# Patient Record
Sex: Female | Born: 1985 | Race: White | Hispanic: No | Marital: Married | Smoking: Former smoker
Health system: Southern US, Community
[De-identification: ages and names within clinical notes are randomized; demographics above are authoritative.]

## PROBLEM LIST (undated history)

## (undated) DIAGNOSIS — J45909 Unspecified asthma, uncomplicated: Secondary | ICD-10-CM

## (undated) HISTORY — PX: CHOLECYSTECTOMY: SHX55

## (undated) HISTORY — PX: TONSILECTOMY, ADENOIDECTOMY, BILATERAL MYRINGOTOMY AND TUBES: SHX2538

## (undated) HISTORY — PX: WISDOM TOOTH EXTRACTION: SHX21

---

## 2017-01-15 NOTE — L&D Delivery Note (Addendum)
Delivery Note At 4:37 AM a viable female was delivered via Vaginal, Spontaneous (Presentation: LOA).  APGAR: 9, 9; weight  pending.   Placenta status: intact w/ 3vc:  with the following complications: none .  Cord pH: pending.  Placenta to path  Delivery performed with Aundria Rudogers, CNM present.  FOB cut cord at ~1.765min post delivery.  Placenta delievered without incident.  Anesthesia:  epidural Episiotomy:  N./A Lacerations:  1st degree perineal, hemostatic not requiring sutures Suture Repair: n/a Est. Blood Loss (mL):  300  Mom to postpartum, 3rd floor for 24hrs mag.  Baby to Couplet care / Skin to Skin.  Marthenia RollingScott Bland, DO 09/14/2017, 5:03 AM   I was gloved and present at this delivery in its entirety   Sharyon CableVeronica C Raymona Boss, CNM 09/14/17, 8:37 AM

## 2017-09-13 ENCOUNTER — Inpatient Hospital Stay (HOSPITAL_BASED_OUTPATIENT_CLINIC_OR_DEPARTMENT_OTHER): Payer: Self-pay

## 2017-09-13 ENCOUNTER — Inpatient Hospital Stay (HOSPITAL_COMMUNITY)
Admission: AD | Admit: 2017-09-13 | Discharge: 2017-09-16 | DRG: 807 | Disposition: A | Discharge status: Home / Self Care | Payer: Self-pay | Source: Ambulatory Visit | Attending: Obstetrics & Gynecology | Admitting: Obstetrics & Gynecology

## 2017-09-13 ENCOUNTER — Other Ambulatory Visit: Payer: Self-pay

## 2017-09-13 ENCOUNTER — Encounter (HOSPITAL_COMMUNITY): Payer: Self-pay | Admitting: *Deleted

## 2017-09-13 DIAGNOSIS — Z87891 Personal history of nicotine dependence: Secondary | ICD-10-CM

## 2017-09-13 DIAGNOSIS — O09293 Supervision of pregnancy with other poor reproductive or obstetric history, third trimester: Secondary | ICD-10-CM

## 2017-09-13 DIAGNOSIS — Z789 Other specified health status: Secondary | ICD-10-CM | POA: Diagnosis present

## 2017-09-13 DIAGNOSIS — O1413 Severe pre-eclampsia, third trimester: Secondary | ICD-10-CM

## 2017-09-13 DIAGNOSIS — O1414 Severe pre-eclampsia complicating childbirth: Principal | ICD-10-CM | POA: Diagnosis present

## 2017-09-13 DIAGNOSIS — O0933 Supervision of pregnancy with insufficient antenatal care, third trimester: Secondary | ICD-10-CM

## 2017-09-13 DIAGNOSIS — Z3A36 36 weeks gestation of pregnancy: Secondary | ICD-10-CM

## 2017-09-13 DIAGNOSIS — Z880 Allergy status to penicillin: Secondary | ICD-10-CM

## 2017-09-13 DIAGNOSIS — Z3A39 39 weeks gestation of pregnancy: Secondary | ICD-10-CM

## 2017-09-13 DIAGNOSIS — O133 Gestational [pregnancy-induced] hypertension without significant proteinuria, third trimester: Secondary | ICD-10-CM

## 2017-09-13 DIAGNOSIS — O1493 Unspecified pre-eclampsia, third trimester: Secondary | ICD-10-CM

## 2017-09-13 HISTORY — DX: Unspecified asthma, uncomplicated: J45.909

## 2017-09-13 LAB — URINALYSIS, ROUTINE W REFLEX MICROSCOPIC
Bilirubin Urine: NEGATIVE
Glucose, UA: NEGATIVE mg/dL
Ketones, ur: NEGATIVE mg/dL
Nitrite: NEGATIVE
Protein, ur: 30 mg/dL — AB
SPECIFIC GRAVITY, URINE: 1.011 (ref 1.005–1.030)
pH: 6 (ref 5.0–8.0)

## 2017-09-13 LAB — PROTEIN / CREATININE RATIO, URINE
CREATININE, URINE: 72 mg/dL
Protein Creatinine Ratio: 0.93 mg/mg{Cre} — ABNORMAL HIGH (ref 0.00–0.15)
TOTAL PROTEIN, URINE: 67 mg/dL

## 2017-09-13 LAB — RAPID HIV SCREEN (HIV 1/2 AB+AG)
HIV 1/2 ANTIBODIES: NONREACTIVE
HIV-1 P24 Antigen - HIV24: NONREACTIVE

## 2017-09-13 LAB — COMPREHENSIVE METABOLIC PANEL
ALBUMIN: 2.7 g/dL — AB (ref 3.5–5.0)
ALK PHOS: 175 U/L — AB (ref 38–126)
ALT: 16 U/L (ref 0–44)
ANION GAP: 12 (ref 5–15)
AST: 25 U/L (ref 15–41)
BUN: 10 mg/dL (ref 6–20)
CALCIUM: 8.5 mg/dL — AB (ref 8.9–10.3)
CO2: 17 mmol/L — AB (ref 22–32)
Chloride: 105 mmol/L (ref 98–111)
Creatinine, Ser: 0.6 mg/dL (ref 0.44–1.00)
GFR calc Af Amer: >60 mL/min (ref >60)
GFR calc non Af Amer: >60 mL/min (ref >60)
Glucose, Bld: 71 mg/dL (ref 70–99)
Potassium: 3.8 mmol/L (ref 3.5–5.1)
SODIUM: 134 mmol/L — AB (ref 135–145)
TOTAL PROTEIN: 6.5 g/dL (ref 6.5–8.1)
Total Bilirubin: 0.9 mg/dL (ref 0.3–1.2)

## 2017-09-13 LAB — DIFFERENTIAL
BASOS ABS: 0 10*3/uL (ref 0.0–0.1)
Basophils Relative: 0 %
EOS ABS: 0.1 10*3/uL (ref 0.0–0.7)
Eosinophils Relative: 1 %
LYMPHS PCT: 22 %
Lymphs Abs: 2.3 10*3/uL (ref 0.7–4.0)
Monocytes Absolute: 0.4 10*3/uL (ref 0.1–1.0)
Monocytes Relative: 4 %
NEUTROS PCT: 73 %
Neutro Abs: 7.6 10*3/uL (ref 1.7–7.7)

## 2017-09-13 LAB — TYPE AND SCREEN
ABO/RH(D): A POS
ANTIBODY SCREEN: NEGATIVE

## 2017-09-13 LAB — RAPID URINE DRUG SCREEN, HOSP PERFORMED
AMPHETAMINES: NOT DETECTED
BARBITURATES: NOT DETECTED
Benzodiazepines: NOT DETECTED
Cocaine: NOT DETECTED
Opiates: NOT DETECTED
Tetrahydrocannabinol: NOT DETECTED

## 2017-09-13 LAB — CBC
HCT: 36 % (ref 36.0–46.0)
HEMOGLOBIN: 11.8 g/dL — AB (ref 12.0–15.0)
MCH: 28.5 pg (ref 26.0–34.0)
MCHC: 32.8 g/dL (ref 30.0–36.0)
MCV: 87 fL (ref 78.0–100.0)
Platelets: 357 10*3/uL (ref 150–400)
RBC: 4.14 MIL/uL (ref 3.87–5.11)
RDW: 13.7 % (ref 11.5–15.5)
WBC: 10.5 10*3/uL (ref 4.0–10.5)

## 2017-09-13 LAB — ABO/RH: ABO/RH(D): A POS

## 2017-09-13 LAB — GROUP B STREP BY PCR: GROUP B STREP BY PCR: POSITIVE — AB

## 2017-09-13 LAB — HEPATITIS B SURFACE ANTIGEN: Hepatitis B Surface Ag: NEGATIVE

## 2017-09-13 MED ORDER — FLEET ENEMA 7-19 GM/118ML RE ENEM: 1.0000 | ENEMA | RECTAL | Status: DC | PRN

## 2017-09-13 MED ORDER — LABETALOL HCL 5 MG/ML IV SOLN
20.0000 mg | INTRAVENOUS | Status: DC | PRN
  Administered 2017-09-13: 20 mg via INTRAVENOUS
  Filled 2017-09-13: qty 4

## 2017-09-13 MED ORDER — MAGNESIUM SULFATE BOLUS VIA INFUSION
4.0000 g | Freq: Once | INTRAVENOUS | Status: AC
  Administered 2017-09-13: 4 g via INTRAVENOUS
  Filled 2017-09-13: qty 500

## 2017-09-13 MED ORDER — OXYCODONE-ACETAMINOPHEN 5-325 MG PO TABS: 1.0000 | ORAL_TABLET | ORAL | Status: DC | PRN

## 2017-09-13 MED ORDER — FENTANYL CITRATE (PF) 100 MCG/2ML IJ SOLN
100.0000 ug | INTRAMUSCULAR | Status: DC | PRN
  Administered 2017-09-14 (×2): 100 ug via INTRAVENOUS
  Filled 2017-09-13 (×2): qty 2

## 2017-09-13 MED ORDER — LIDOCAINE HCL (PF) 1 % IJ SOLN
30.0000 mL | INTRAMUSCULAR | Status: DC | PRN
  Filled 2017-09-13: qty 30

## 2017-09-13 MED ORDER — MAGNESIUM SULFATE 40 G IN LACTATED RINGERS - SIMPLE
2.0000 g/h | INTRAVENOUS | Status: AC
  Administered 2017-09-13 – 2017-09-14 (×2): 2 g/h via INTRAVENOUS
  Filled 2017-09-13 (×2): qty 500

## 2017-09-13 MED ORDER — MISOPROSTOL 50MCG HALF TABLET
50.0000 ug | ORAL_TABLET | ORAL | Status: DC | PRN
  Administered 2017-09-13: 50 ug via BUCCAL
  Filled 2017-09-13 (×2): qty 1

## 2017-09-13 MED ORDER — SOD CITRATE-CITRIC ACID 500-334 MG/5ML PO SOLN: 30.0000 mL | ORAL | Status: DC | PRN

## 2017-09-13 MED ORDER — OXYTOCIN 40 UNITS IN LACTATED RINGERS INFUSION - SIMPLE MED
2.5000 [IU]/h | INTRAVENOUS | Status: DC
  Filled 2017-09-13: qty 1000

## 2017-09-13 MED ORDER — LACTATED RINGERS IV SOLN: INTRAVENOUS | Status: DC

## 2017-09-13 MED ORDER — LABETALOL HCL 5 MG/ML IV SOLN: 40.0000 mg | INTRAVENOUS | Status: DC | PRN

## 2017-09-13 MED ORDER — OXYCODONE-ACETAMINOPHEN 5-325 MG PO TABS: 2.0000 | ORAL_TABLET | ORAL | Status: DC | PRN

## 2017-09-13 MED ORDER — VANCOMYCIN HCL IN DEXTROSE 1-5 GM/200ML-% IV SOLN
1000.0000 mg | Freq: Two times a day (BID) | INTRAVENOUS | Status: DC
  Administered 2017-09-13: 1000 mg via INTRAVENOUS
  Filled 2017-09-13 (×2): qty 200

## 2017-09-13 MED ORDER — OXYTOCIN BOLUS FROM INFUSION
500.0000 mL | Freq: Once | INTRAVENOUS | Status: AC
  Administered 2017-09-14: 500 mL via INTRAVENOUS

## 2017-09-13 MED ORDER — HYDRALAZINE HCL 20 MG/ML IJ SOLN: 10.0000 mg | INTRAMUSCULAR | Status: DC | PRN

## 2017-09-13 MED ORDER — BETAMETHASONE SOD PHOS & ACET 6 (3-3) MG/ML IJ SUSP
12.0000 mg | Freq: Once | INTRAMUSCULAR | Status: AC
  Administered 2017-09-13: 12 mg via INTRAMUSCULAR
  Filled 2017-09-13: qty 2

## 2017-09-13 MED ORDER — ONDANSETRON HCL 4 MG/2ML IJ SOLN: 4.0000 mg | Freq: Four times a day (QID) | INTRAMUSCULAR | Status: DC | PRN

## 2017-09-13 MED ORDER — LABETALOL HCL 5 MG/ML IV SOLN: 80.0000 mg | INTRAVENOUS | Status: DC | PRN

## 2017-09-13 MED ORDER — ACETAMINOPHEN 325 MG PO TABS
650.0000 mg | ORAL_TABLET | ORAL | Status: DC | PRN
  Administered 2017-09-13: 650 mg via ORAL
  Filled 2017-09-13: qty 2

## 2017-09-13 MED ORDER — LACTATED RINGERS IV SOLN
INTRAVENOUS | Status: DC
  Administered 2017-09-13 – 2017-09-14 (×2): via INTRAVENOUS

## 2017-09-13 MED ORDER — LACTATED RINGERS IV SOLN: 500.0000 mL | INTRAVENOUS | Status: DC | PRN

## 2017-09-13 NOTE — Progress Notes (Signed)
Patient comfortable on exam, headache resolved, no current visual symptoms  Consented to FB placement, placed succesfully with Lafonda Mossesiana RN present.  Cervical exam 1/thick/-3  Starting cytotek buccal  -Dr. Parke SimmersBland

## 2017-09-13 NOTE — MAU Provider Note (Signed)
History     CSN: [id number]  Arrival date and time: 09/13/17 1842   First Provider Initiated Contact with Patient 09/13/17 1913      Chief Complaint  Patient presents with  . Hypertension  . Headache  . Dizziness  . Nausea   HPI Ariel Hayes is a 32 y.o. G3P2 at approximately [redacted] weeks gestation who presents with elevated blood pressure and headache. She states she got a headache and blurred vision this morning and noticed that her blood pressure was elevated. She states she has continued to feel bad so she came in. Denies any bleeding or leaking. Reports normal fetal movement.   She has had no prenatal care this pregnancy. She reports an ultrasound around [redacted] weeks gestation but is unsure of the results.   OB History    Gravida  3   Para      Term      Preterm      AB      Living  2     SAB      TAB      Ectopic      Multiple      Live Births  2           Past Medical History:  Diagnosis Date  . Asthma     Past Surgical History:  Procedure Laterality Date  . CHOLECYSTECTOMY    . TONSILECTOMY, ADENOIDECTOMY, BILATERAL MYRINGOTOMY AND TUBES    . WISDOM TOOTH EXTRACTION      Family History  Adopted: Yes    Social History   Tobacco Use  . Smoking status: Former Games developermoker  . Smokeless tobacco: Never Used  Substance Use Topics  . Alcohol use: Never    Frequency: Never  . Drug use: Never    Allergies:  Allergies  Allergen Reactions  . Macrobid [Nitrofurantoin] Nausea And Vomiting  . Augmentin [Amoxicillin-Pot Clavulanate] Rash  . Ceclor [Cefaclor] Rash  . Penicillins Rash    Medications Prior to Admission  Medication Sig Dispense Refill Last Dose  . acetaminophen (TYLENOL) 325 MG tablet Take 650 mg by mouth every 6 (six) hours as needed.   09/13/2017 at Unknown time  . Prenatal Vit-Fe Fumarate-FA (PRENATAL MULTIVITAMIN) TABS tablet Take 1 tablet by mouth daily at 12 noon.   09/13/2017 at Unknown time    Review of Systems   Constitutional: Negative.  Negative for fatigue and fever.  HENT: Negative.   Eyes: Positive for visual disturbance.  Respiratory: Negative.  Negative for shortness of breath.   Cardiovascular: Negative.  Negative for chest pain.  Gastrointestinal: Negative.  Negative for abdominal pain, constipation, diarrhea, nausea and vomiting.  Genitourinary: Negative.  Negative for dysuria, vaginal bleeding and vaginal discharge.  Neurological: Positive for headaches. Negative for dizziness.   Physical Exam   Blood pressure (!) 137/96, pulse 78, temperature 98.2 F (36.8 C), temperature source Oral, resp. rate 17, height 5\' 4"  (1.626 m), weight 67.9 kg, SpO2 100 %.  Patient Vitals for the past 24 hrs:  BP Temp Temp src Pulse Resp SpO2 Height Weight  09/13/17 2010 (!) 131/98 - - 89 - - - -  09/13/17 2000 (!) 137/96 - - 78 - - - -  09/13/17 1950 (!) 132/94 - - 85 - - - -  09/13/17 1931 (!) 163/93 - - 77 - - - -  09/13/17 1923 (!) 175/106 - - 71 - - - -  09/13/17 1905 (!) 172/114 - - 85 - - - -  09/13/17 1853 (!) 179/110 98.2 F (36.8 C) Oral 86 17 100 % 5\' 4"  (1.626 m) 67.9 kg    Physical Exam  Nursing note and vitals reviewed. Constitutional: She is oriented to person, place, and time. She appears well-developed and well-nourished. No distress.  HENT:  Head: Normocephalic.  Eyes: Pupils are equal, round, and reactive to light.  Cardiovascular: Normal rate, regular rhythm and normal heart sounds.  Respiratory: Effort normal and breath sounds normal. No respiratory distress.  GI: Soft. Bowel sounds are normal. She exhibits no distension. There is no tenderness.  Neurological: She is alert and oriented to person, place, and time.  Reflex Scores:      Patellar reflexes are 3+ on the right side and 3+ on the left side. No clonus  Skin: Skin is warm and dry.  Psychiatric: She has a normal mood and affect. Her behavior is normal. Judgment and thought content normal.   Fetal  Tracing:  Baseline: 125 Variability: moderate Accels: 15x15 Decels: none  Toco: ui  MAU Course  Procedures Results for orders placed or performed during the hospital encounter of 09/13/17 (from the past 24 hour(s))  CBC     Status: Abnormal   Collection Time: 09/13/17  7:03 PM  Result Value Ref Range   WBC 10.5 4.0 - 10.5 K/uL   RBC 4.14 3.87 - 5.11 MIL/uL   Hemoglobin 11.8 (L) 12.0 - 15.0 g/dL   HCT 16.1 09.6 - 04.5 %   MCV 87.0 78.0 - 100.0 fL   MCH 28.5 26.0 - 34.0 pg   MCHC 32.8 30.0 - 36.0 g/dL   RDW 40.9 81.1 - 91.4 %   Platelets 357 150 - 400 K/uL  Comprehensive metabolic panel     Status: Abnormal   Collection Time: 09/13/17  7:03 PM  Result Value Ref Range   Sodium 134 (L) 135 - 145 mmol/L   Potassium 3.8 3.5 - 5.1 mmol/L   Chloride 105 98 - 111 mmol/L   CO2 17 (L) 22 - 32 mmol/L   Glucose, Bld 71 70 - 99 mg/dL   BUN 10 6 - 20 mg/dL   Creatinine, Ser 7.82 0.44 - 1.00 mg/dL   Calcium 8.5 (L) 8.9 - 10.3 mg/dL   Total Protein 6.5 6.5 - 8.1 g/dL   Albumin 2.7 (L) 3.5 - 5.0 g/dL   AST 25 15 - 41 U/L   ALT 16 0 - 44 U/L   Alkaline Phosphatase 175 (H) 38 - 126 U/L   Total Bilirubin 0.9 0.3 - 1.2 mg/dL   GFR calc non Af Amer >60 >60 mL/min   GFR calc Af Amer >60 >60 mL/min   Anion gap 12 5 - 15  Protein / creatinine ratio, urine     Status: Abnormal   Collection Time: 09/13/17  7:03 PM  Result Value Ref Range   Creatinine, Urine 72.00 mg/dL   Total Protein, Urine 67 mg/dL   Protein Creatinine Ratio 0.93 (H) 0.00 - 0.15 mg/mg[Cre]  Urinalysis, Routine w reflex microscopic     Status: Abnormal   Collection Time: 09/13/17  7:03 PM  Result Value Ref Range   Color, Urine YELLOW YELLOW   APPearance HAZY (A) CLEAR   Specific Gravity, Urine 1.011 1.005 - 1.030   pH 6.0 5.0 - 8.0   Glucose, UA NEGATIVE NEGATIVE mg/dL   Hgb urine dipstick SMALL (A) NEGATIVE   Bilirubin Urine NEGATIVE NEGATIVE   Ketones, ur NEGATIVE NEGATIVE mg/dL   Protein, ur 30 (A) NEGATIVE  mg/dL    Nitrite NEGATIVE NEGATIVE   Leukocytes, UA SMALL (A) NEGATIVE   RBC / HPF 0-5 0 - 5 RBC/hpf   WBC, UA 11-20 0 - 5 WBC/hpf   Bacteria, UA MANY (A) NONE SEEN   Squamous Epithelial / LPF 0-5 0 - 5   Mucus PRESENT   Urine rapid drug screen (hosp performed)     Status: None   Collection Time: 09/13/17  7:03 PM  Result Value Ref Range   Opiates NONE DETECTED NONE DETECTED   Cocaine NONE DETECTED NONE DETECTED   Benzodiazepines NONE DETECTED NONE DETECTED   Amphetamines NONE DETECTED NONE DETECTED   Tetrahydrocannabinol NONE DETECTED NONE DETECTED   Barbiturates NONE DETECTED NONE DETECTED  Differential     Status: None   Collection Time: 09/13/17  7:16 PM  Result Value Ref Range   Neutrophils Relative % 73 %   Neutro Abs 7.6 1.7 - 7.7 K/uL   Lymphocytes Relative 22 %   Lymphs Abs 2.3 0.7 - 4.0 K/uL   Monocytes Relative 4 %   Monocytes Absolute 0.4 0.1 - 1.0 K/uL   Eosinophils Relative 1 %   Eosinophils Absolute 0.1 0.0 - 0.7 K/uL   Basophils Relative 0 %   Basophils Absolute 0.0 0.0 - 0.1 K/uL  Type and screen [facility]     Status: None   Collection Time: 09/13/17  7:16 PM  Result Value Ref Range   ABO/RH(D) A POS    Antibody Screen NEG    Sample Expiration      09/16/2017 Performed at [facility], [address], [place], [place] [address]   Rapid HIV screen (HIV 1/2 Ab+Ag)     Status: None   Collection Time: 09/13/17  7:16 PM  Result Value Ref Range   HIV-1 P24 Antigen - HIV24 NON REACTIVE NON REACTIVE   HIV 1/2 Antibodies NON REACTIVE NON REACTIVE   Interpretation (HIV Ag Ab)      A non reactive test result means that HIV 1 or HIV 2 antibodies and HIV 1 p24 antigen were not detected in the specimen.   MDM UA UDS CBC, CMP, Protein/creat ratio Preeclampsia order set  Mag 4g bolus, 2g/hr BMZ [place] MFM OB Comp +14- estimated gestational age 100w 3d, normal AFI, anterior placenta  Consulted with Dr. Debroah Loop- will admit to labor and delivery for  induction  Assessment and Plan   1. Severe pre-eclampsia in third trimester   2. No prenatal care in current pregnancy in third trimester   3. PIH (pregnancy induced hypertension), third trimester    -Admit to labor and delivery -Care turned over to labor team.  Rolm Bookbinder CNM 09/13/2017, 8:07 PM

## 2017-09-13 NOTE — MAU Note (Signed)
BP was really high this morning,  Retook it a little while ago and it was 200/123. Hx of pre-eclampsia.  Has a headache, nausea and is dizzy.  Has been having some blurring. Denies epigastric pain, swelling has gone down. No PNC

## 2017-09-13 NOTE — H&P (Addendum)
OBSTETRIC ADMISSION HISTORY AND PHYSICAL *senstive exam performed with RN present  Ariel Hayes is a 32 y.o. female G3P0 with IUP at [redacted]w[redacted]d by LMP (36/3 by bedside US 8/30) presenting for IOL due to pre-eclampsia with severe ranges and features.   Reports fetal movement. Denies vaginal bleeding.  She received her prenatal care at no prenatal care.  Support person in labor: FOB  Ultrasounds . Anatomy U/S: none on record, bedside on admission unable to determine sex  Prenatal History/Complications: . No prenatal care . PEC w/ severe features ranges . GBS unknown  Past Medical History: Past Medical History:  Diagnosis Date  . Asthma     Past Surgical History: Past Surgical History:  Procedure Laterality Date  . CHOLECYSTECTOMY    . TONSILECTOMY, ADENOIDECTOMY, BILATERAL MYRINGOTOMY AND TUBES    . WISDOM TOOTH EXTRACTION      Obstetrical History: OB History    Gravida  3   Para      Term      Preterm      AB      Living  2     SAB      TAB      Ectopic      Multiple      Live Births  2           Social History: Social History   Socioeconomic History  . Marital status: Married    Spouse name: Not on file  . Number of children: Not on file  . Years of education: Not on file  . Highest education level: Not on file  Occupational History  . Not on file  Social Needs  . Financial resource strain: Not on file  . Food insecurity:    Worry: Not on file    Inability: Not on file  . Transportation needs:    Medical: Not on file    Non-medical: Not on file  Tobacco Use  . Smoking status: Former Games developer  . Smokeless tobacco: Never Used  Substance and Sexual Activity  . Alcohol use: Never    Frequency: Never  . Drug use: Never  . Sexual activity: Yes  Lifestyle  . Physical activity:    Days per week: Not on file    Minutes per session: Not on file  . Stress: Not on file  Relationships  . Social connections:    Talks on phone: Not on file     Gets together: Not on file    Attends religious service: Not on file    Active member of club or organization: Not on file    Attends meetings of clubs or organizations: Not on file    Relationship status: Not on file  Other Topics Concern  . Not on file  Social History Narrative  . Not on file    Family History: Family History  Adopted: Yes    Allergies: Allergies  Allergen Reactions  . Macrobid [Nitrofurantoin] Nausea And Vomiting  . Augmentin [Amoxicillin-Pot Clavulanate] Rash  . Ceclor [Cefaclor] Rash  . Penicillins Rash    Medications Prior to Admission  Medication Sig Dispense Refill Last Dose  . acetaminophen (TYLENOL) 325 MG tablet Take 650 mg by mouth every 6 (six) hours as needed.   09/13/2017 at Unknown time  . Prenatal Vit-Fe Fumarate-FA (PRENATAL MULTIVITAMIN) TABS tablet Take 1 tablet by mouth daily at 12 noon.   09/13/2017 at Unknown time     Review of Systems  All systems reviewed and negative except as  stated in HPI  Blood pressure (!) 149/99, pulse 73, temperature 98.2 F (36.8 C), temperature source Oral, resp. rate 17, height 5\' 4"  (1.626 m), weight 67.9 kg, SpO2 100 %. General appearance: alert, cooperative, appears stated age and no distress Lungs: no respiratory distress Heart: regular rate  Abdomen: soft, non-tender; gravid b Pelvic: no exterior lesions noted Extremities: Homans sign is negative, no sign of DVT Presentation: cephalic Fetal monitoring: Cat1 by external monitor Uterine activity: intermittent contractions Dilation: Closed Effacement (%): Thick Station: -3 Exam by:: DR BLAND  Prenatal labs: ABO, Rh: --/--/A POS, A POS Performed at [facility], [address] [place], [place] [address]  [phone](08/30 1916) Antibody: NEG (08/30 1916) Rubella:   pending RPR:   pending HBsAg:   pending HIV: NON REACTIVE (08/30 1916)  GBS:   pending Glucola: not taken during pregnancy Genetic screening:  None taken  Prenatal Transfer Tool   Maternal Diabetes: No Genetic Screening: none taken during pregnancy Maternal Ultrasounds/Referrals: no prenatal Fetal Ultrasounds or other Referrals:  No prenatal Maternal Substance Abuse:  No Significant Maternal Medications:  None Significant Maternal Lab Results: no prenatal labs prior to admission  Results for orders placed or performed during the hospital encounter of 09/13/17 (from the past 24 hour(s))  CBC   Collection Time: 09/13/17  7:03 PM  Result Value Ref Range   WBC 10.5 4.0 - 10.5 K/uL   RBC 4.14 3.87 - 5.11 MIL/uL   Hemoglobin 11.8 (L) 12.0 - 15.0 g/dL   HCT 13.236.0 44.036.0 - 10.246.0 %   MCV 87.0 78.0 - 100.0 fL   MCH 28.5 26.0 - 34.0 pg   MCHC 32.8 30.0 - 36.0 g/dL   RDW 72.513.7 36.611.5 - 44.015.5 %   Platelets 357 150 - 400 K/uL  Comprehensive metabolic panel   Collection Time: 09/13/17  7:03 PM  Result Value Ref Range   Sodium 134 (L) 135 - 145 mmol/L   Potassium 3.8 3.5 - 5.1 mmol/L   Chloride 105 98 - 111 mmol/L   CO2 17 (L) 22 - 32 mmol/L   Glucose, Bld 71 70 - 99 mg/dL   BUN 10 6 - 20 mg/dL   Creatinine, Ser 3.470.60 0.44 - 1.00 mg/dL   Calcium 8.5 (L) 8.9 - 10.3 mg/dL   Total Protein 6.5 6.5 - 8.1 g/dL   Albumin 2.7 (L) 3.5 - 5.0 g/dL   AST 25 15 - 41 U/L   ALT 16 0 - 44 U/L   Alkaline Phosphatase 175 (H) 38 - 126 U/L   Total Bilirubin 0.9 0.3 - 1.2 mg/dL   GFR calc non Af Amer >60 >60 mL/min   GFR calc Af Amer >60 >60 mL/min   Anion gap 12 5 - 15  Protein / creatinine ratio, urine   Collection Time: 09/13/17  7:03 PM  Result Value Ref Range   Creatinine, Urine 72.00 mg/dL   Total Protein, Urine 67 mg/dL   Protein Creatinine Ratio 0.93 (H) 0.00 - 0.15 mg/mg[Cre]  Urinalysis, Routine w reflex microscopic   Collection Time: 09/13/17  7:03 PM  Result Value Ref Range   Color, Urine YELLOW YELLOW   APPearance HAZY (A) CLEAR   Specific Gravity, Urine 1.011 1.005 - 1.030   pH 6.0 5.0 - 8.0   Glucose, UA NEGATIVE NEGATIVE mg/dL   Hgb urine dipstick SMALL (A) NEGATIVE    Bilirubin Urine NEGATIVE NEGATIVE   Ketones, ur NEGATIVE NEGATIVE mg/dL   Protein, ur 30 (A) NEGATIVE mg/dL   Nitrite  NEGATIVE NEGATIVE   Leukocytes, UA SMALL (A) NEGATIVE   RBC / HPF 0-5 0 - 5 RBC/hpf   WBC, UA 11-20 0 - 5 WBC/hpf   Bacteria, UA MANY (A) NONE SEEN   Squamous Epithelial / LPF 0-5 0 - 5   Mucus PRESENT   Urine rapid drug screen (hosp performed)   Collection Time: 09/13/17  7:03 PM  Result Value Ref Range   Opiates NONE DETECTED NONE DETECTED   Cocaine NONE DETECTED NONE DETECTED   Benzodiazepines NONE DETECTED NONE DETECTED   Amphetamines NONE DETECTED NONE DETECTED   Tetrahydrocannabinol NONE DETECTED NONE DETECTED   Barbiturates NONE DETECTED NONE DETECTED  Differential   Collection Time: 09/13/17  7:16 PM  Result Value Ref Range   Neutrophils Relative % 73 %   Neutro Abs 7.6 1.7 - 7.7 K/uL   Lymphocytes Relative 22 %   Lymphs Abs 2.3 0.7 - 4.0 K/uL   Monocytes Relative 4 %   Monocytes Absolute 0.4 0.1 - 1.0 K/uL   Eosinophils Relative 1 %   Eosinophils Absolute 0.1 0.0 - 0.7 K/uL   Basophils Relative 0 %   Basophils Absolute 0.0 0.0 - 0.1 K/uL  Rapid HIV screen (HIV 1/2 Ab+Ag)   Collection Time: 09/13/17  7:16 PM  Result Value Ref Range   HIV-1 P24 Antigen - HIV24 NON REACTIVE NON REACTIVE   HIV 1/2 Antibodies NON REACTIVE NON REACTIVE   Interpretation (HIV Ag Ab)      A non reactive test result means that HIV 1 or HIV 2 antibodies and HIV 1 p24 antigen were not detected in the specimen.  Type and screen [facility]   Collection Time: 09/13/17  7:16 PM  Result Value Ref Range   ABO/RH(D) A POS    Antibody Screen NEG    Sample Expiration      09/16/2017 Performed at [facility], [address] [place], [place] [address]   ABO/Rh   Collection Time: 09/13/17  7:16 PM  Result Value Ref Range   ABO/RH(D)      A POS Performed at [facility], [address], [place], [place] [address]     Patient Active Problem List    Diagnosis Date Noted  . Preeclampsia, severe, third trimester 09/13/2017    Assessment/Plan:  Solana Coggin is a 32 y.o. G3P0 at [redacted]w[redacted]d here for [facility] w/ severe features (headache/dizziness/blurriness) and ranges (170-200sbp at home)  Labor: IOL once to birthing suites, plan FB and cytotek -- pain control: fentanyl until epidural  Fetal Wellbeing: EFW 6.5 by Leopold's. Cephalic by bsus/sutures.  -- GBS (unknown) - on vanc due to pcn allergy -- continuous fetal monitoring - Cat1 by external monitor --BMZ x1 8/30 due to bsus dating of 36wks   Preeclampsia with Severe Features -- continue Mg++ -- labetalol IV prn   Postpartum Planning -- bottle/POP?  -- SW consult for late to prenatal care  Marthenia Rolling, DO  Attestation: I have seen this patient and agree with the resident's documentation. I have examined them separately, and we have discussed the plan of care.   Cristal Deer. Earlene Plater, DO OB/GYN Fellow

## 2017-09-13 NOTE — MAU Note (Signed)
PT SAYS SHE WENT  TO [place] IN April - HAD U/S-  EDC- 09-16-2017,     SAYS SHE TOOK BP AT HOME- ELEVATED.   SLIGHT H/A- DIZZY AND FATIGUE .    FEELS PRESSURE ON TOP OF  HEAD- STARTED THIS AM .   DENIES HSV AND MRSA. .Marland Kitchen

## 2017-09-14 ENCOUNTER — Inpatient Hospital Stay (HOSPITAL_COMMUNITY): Payer: Self-pay | Admitting: Anesthesiology

## 2017-09-14 ENCOUNTER — Encounter (HOSPITAL_COMMUNITY): Payer: Self-pay | Admitting: *Deleted

## 2017-09-14 DIAGNOSIS — Z3A39 39 weeks gestation of pregnancy: Secondary | ICD-10-CM

## 2017-09-14 DIAGNOSIS — O1414 Severe pre-eclampsia complicating childbirth: Secondary | ICD-10-CM

## 2017-09-14 LAB — RPR: RPR: NONREACTIVE

## 2017-09-14 LAB — CBC
HCT: 33.2 % — ABNORMAL LOW (ref 36.0–46.0)
Hemoglobin: 10.9 g/dL — ABNORMAL LOW (ref 12.0–15.0)
MCH: 28.8 pg (ref 26.0–34.0)
MCHC: 32.8 g/dL (ref 30.0–36.0)
MCV: 87.6 fL (ref 78.0–100.0)
PLATELETS: 317 10*3/uL (ref 150–400)
RBC: 3.79 MIL/uL — ABNORMAL LOW (ref 3.87–5.11)
RDW: 14 % (ref 11.5–15.5)
WBC: 11.9 10*3/uL — AB (ref 4.0–10.5)

## 2017-09-14 LAB — RUBELLA SCREEN: RUBELLA: 1.68 {index} (ref >0.99)

## 2017-09-14 MED ORDER — ZOLPIDEM TARTRATE 5 MG PO TABS: 5.0000 mg | ORAL_TABLET | Freq: Every evening | ORAL | Status: DC | PRN

## 2017-09-14 MED ORDER — PHENYLEPHRINE 40 MCG/ML (10ML) SYRINGE FOR IV PUSH (FOR BLOOD PRESSURE SUPPORT)
80.0000 ug | PREFILLED_SYRINGE | INTRAVENOUS | Status: DC | PRN
  Filled 2017-09-14: qty 5

## 2017-09-14 MED ORDER — LACTATED RINGERS IV SOLN: 500.0000 mL | Freq: Once | INTRAVENOUS | Status: DC

## 2017-09-14 MED ORDER — PHENYLEPHRINE 40 MCG/ML (10ML) SYRINGE FOR IV PUSH (FOR BLOOD PRESSURE SUPPORT)
80.0000 ug | PREFILLED_SYRINGE | INTRAVENOUS | Status: DC | PRN
  Filled 2017-09-14: qty 10
  Filled 2017-09-14: qty 5

## 2017-09-14 MED ORDER — DIPHENHYDRAMINE HCL 50 MG/ML IJ SOLN: 12.5000 mg | INTRAMUSCULAR | Status: DC | PRN

## 2017-09-14 MED ORDER — SIMETHICONE 80 MG PO CHEW: 80.0000 mg | CHEWABLE_TABLET | ORAL | Status: DC | PRN

## 2017-09-14 MED ORDER — WITCH HAZEL-GLYCERIN EX PADS: 1.0000 "application " | MEDICATED_PAD | CUTANEOUS | Status: DC | PRN

## 2017-09-14 MED ORDER — MISOPROSTOL 200 MCG PO TABS
ORAL_TABLET | ORAL | Status: AC
  Filled 2017-09-14: qty 4

## 2017-09-14 MED ORDER — BENZOCAINE-MENTHOL 20-0.5 % EX AERO
1.0000 "application " | INHALATION_SPRAY | CUTANEOUS | Status: DC | PRN
  Administered 2017-09-14: 1 via TOPICAL
  Filled 2017-09-14: qty 56

## 2017-09-14 MED ORDER — MISOPROSTOL 200 MCG PO TABS
400.0000 ug | ORAL_TABLET | Freq: Once | ORAL | Status: AC
  Administered 2017-09-14: 400 ug via BUCCAL

## 2017-09-14 MED ORDER — ONDANSETRON HCL 4 MG PO TABS: 4.0000 mg | ORAL_TABLET | ORAL | Status: DC | PRN

## 2017-09-14 MED ORDER — PHENYLEPHRINE 40 MCG/ML (10ML) SYRINGE FOR IV PUSH (FOR BLOOD PRESSURE SUPPORT): 80.0000 ug | PREFILLED_SYRINGE | INTRAVENOUS | Status: DC | PRN

## 2017-09-14 MED ORDER — EPHEDRINE 5 MG/ML INJ: 10.0000 mg | INTRAVENOUS | Status: DC | PRN

## 2017-09-14 MED ORDER — COCONUT OIL OIL: 1.0000 "application " | TOPICAL_OIL | Status: DC | PRN

## 2017-09-14 MED ORDER — EPHEDRINE 5 MG/ML INJ
10.0000 mg | INTRAVENOUS | Status: DC | PRN
  Filled 2017-09-14: qty 2

## 2017-09-14 MED ORDER — ONDANSETRON HCL 4 MG/2ML IJ SOLN: 4.0000 mg | INTRAMUSCULAR | Status: DC | PRN

## 2017-09-14 MED ORDER — ACETAMINOPHEN 325 MG PO TABS
650.0000 mg | ORAL_TABLET | ORAL | Status: DC | PRN
  Administered 2017-09-14 (×3): 650 mg via ORAL
  Filled 2017-09-14 (×3): qty 2

## 2017-09-14 MED ORDER — DIBUCAINE 1 % RE OINT: 1.0000 "application " | TOPICAL_OINTMENT | RECTAL | Status: DC | PRN

## 2017-09-14 MED ORDER — IBUPROFEN 600 MG PO TABS
600.0000 mg | ORAL_TABLET | Freq: Four times a day (QID) | ORAL | Status: DC
  Administered 2017-09-14 – 2017-09-16 (×10): 600 mg via ORAL
  Filled 2017-09-14 (×10): qty 1

## 2017-09-14 MED ORDER — PRENATAL MULTIVITAMIN CH
1.0000 | ORAL_TABLET | Freq: Every day | ORAL | Status: DC
  Administered 2017-09-14 – 2017-09-16 (×3): 1 via ORAL
  Filled 2017-09-14 (×3): qty 1

## 2017-09-14 MED ORDER — MAGNESIUM SULFATE 40 G IN LACTATED RINGERS - SIMPLE
2.0000 g/h | INTRAVENOUS | Status: AC
  Filled 2017-09-14: qty 500

## 2017-09-14 MED ORDER — MISOPROSTOL 200 MCG PO TABS
400.0000 ug | ORAL_TABLET | Freq: Once | ORAL | Status: AC
  Administered 2017-09-14: 400 ug via RECTAL

## 2017-09-14 MED ORDER — LIDOCAINE HCL (PF) 1 % IJ SOLN
INTRAMUSCULAR | Status: DC | PRN
  Administered 2017-09-14: 5 mL via EPIDURAL

## 2017-09-14 MED ORDER — SENNOSIDES-DOCUSATE SODIUM 8.6-50 MG PO TABS
2.0000 | ORAL_TABLET | ORAL | Status: DC
  Administered 2017-09-14 – 2017-09-15 (×2): 2 via ORAL
  Filled 2017-09-14 (×2): qty 2

## 2017-09-14 MED ORDER — DIPHENHYDRAMINE HCL 25 MG PO CAPS: 25.0000 mg | ORAL_CAPSULE | Freq: Four times a day (QID) | ORAL | Status: DC | PRN

## 2017-09-14 MED ORDER — TETANUS-DIPHTH-ACELL PERTUSSIS 5-2.5-18.5 LF-MCG/0.5 IM SUSP
0.5000 mL | Freq: Once | INTRAMUSCULAR | Status: AC
  Administered 2017-09-15: 0.5 mL via INTRAMUSCULAR
  Filled 2017-09-14: qty 0.5

## 2017-09-14 MED ORDER — FENTANYL 2.5 MCG/ML BUPIVACAINE 1/10 % EPIDURAL INFUSION (WH - ANES)
14.0000 mL/h | INTRAMUSCULAR | Status: DC | PRN
  Administered 2017-09-14: 14 mL/h via EPIDURAL
  Filled 2017-09-14: qty 100

## 2017-09-14 NOTE — Anesthesia Postprocedure Evaluation (Signed)
Anesthesia Post Note  Patient: Norval MortonJessica Camposano  Procedure(s) Performed: AN AD HOC LABOR EPIDURAL     Patient location during evaluation: Mother Baby Anesthesia Type: Epidural Level of consciousness: awake and alert and oriented Pain management: satisfactory to patient Vital Signs Assessment: post-procedure vital signs reviewed and stable Respiratory status: spontaneous breathing and nonlabored ventilation Cardiovascular status: stable Postop Assessment: no headache, no backache, no signs of nausea or vomiting, adequate PO intake, patient able to bend at knees and epidural receding (patient up walking) Anesthetic complications: no    Last Vitals:  Vitals:   09/14/17 0629 09/14/17 0754  BP: (!) 158/102 (!) 151/103  Pulse: 99 97  Resp: 20 16  Temp: 37.8 C 37.1 C  SpO2: 100% 100%    Last Pain:  Vitals:   09/14/17 0824  TempSrc:   PainSc: 8    Pain Goal: Patients Stated Pain Goal: 2 (09/14/17 0713)               Madison HickmanGREGORY,Steaven Wholey

## 2017-09-14 NOTE — Anesthesia Procedure Notes (Signed)
Epidural Patient location during procedure: OB Start time: 09/14/2017 3:33 AM End time: 09/14/2017 3:51 AM  Staffing Anesthesiologist: Trevor IhaHouser, Torria Fromer A, MD Performed: anesthesiologist   Preanesthetic Checklist Completed: patient identified, site marked, surgical consent, pre-op evaluation, timeout performed, IV checked, risks and benefits discussed and monitors and equipment checked  Epidural Patient position: sitting Prep: site prepped and draped and DuraPrep Patient monitoring: continuous pulse ox and blood pressure Approach: midline Location: L2-L3 Injection technique: LOR air  Needle:  Needle type: Tuohy  Needle gauge: 17 G Needle length: 9 cm and 9 Needle insertion depth: 7 cm Catheter type: closed end flexible Catheter size: 19 Gauge Catheter at skin depth: 12 cm Test dose: negative  Assessment Events: blood not aspirated, injection not painful, no injection resistance, negative IV test and no paresthesia  Additional Notes 2 attempts.  Pt tolerated procedure well

## 2017-09-14 NOTE — Anesthesia Preprocedure Evaluation (Addendum)
Anesthesia Evaluation  Patient identified by MRN, date of birth, ID band Patient awake    Reviewed: Allergy & Precautions, NPO status , Patient's Chart, lab work & pertinent test results  Airway Mallampati: II  TM Distance: >3 FB Neck ROM: Full    Dental no notable dental hx. (+) Teeth Intact   Pulmonary asthma , former smoker,    Pulmonary exam normal breath sounds clear to auscultation       Cardiovascular hypertension, Pt. on medications Normal cardiovascular exam Rhythm:Regular Rate:Normal     Neuro/Psych negative neurological ROS  negative psych ROS   GI/Hepatic   Endo/Other    Renal/GU      Musculoskeletal   Abdominal   Peds  Hematology   Anesthesia Other Findings Preeclampsia on Magnesium  Reproductive/Obstetrics (+) Pregnancy                             Lab Results  Component Value Date   WBC 11.9 (H) 09/14/2017   HGB 10.9 (L) 09/14/2017   HCT 33.2 (L) 09/14/2017   MCV 87.6 09/14/2017   PLT 317 09/14/2017    Anesthesia Physical Anesthesia Plan  ASA: III  Anesthesia Plan: Epidural   Post-op Pain Management:    Induction:   PONV Risk Score and Plan:   Airway Management Planned:   Additional Equipment:   Intra-op Plan:   Post-operative Plan:   Informed Consent: I have reviewed the patients History and Physical, chart, labs and discussed the procedure including the risks, benefits and alternatives for the proposed anesthesia with the patient or authorized representative who has indicated his/her understanding and acceptance.     Plan Discussed with:   Anesthesia Plan Comments:         Anesthesia Quick Evaluation

## 2017-09-14 NOTE — Lactation Note (Signed)
This note was copied from a baby's chart. Lactation Consultation Note; Initial visit with this mom on MgSO4. She is an experienced BF mom who had very sore, cracked bleeding nipples with previous babies. Baby now 4 hours old. Mom reports she latched on and off after delivery and has given formula "because baby was hungry". Baby in nursery now so mom can rest. BF brochure given. Reviewed our phone number, OP appointments and BFSG as resources for support after DC. No questions at present. To call for assist prn  Patient Name: Girl Norval MortonJessica Wool ZOXWR'UToday's Date: 09/14/2017 Reason for consult: Initial assessment   Maternal Data Formula Feeding for Exclusion: Yes Reason for exclusion: Mother's choice to formula and breast feed on admission Has patient been taught Hand Expression?: Yes Does the patient have breastfeeding experience prior to this delivery?: Yes  Feeding Feeding Type: Formula Length of feed: 10 min  LATCH Score Latch: Repeated attempts needed to sustain latch, nipple held in mouth throughout feeding, stimulation needed to elicit sucking reflex.  Audible Swallowing: A few with stimulation  Type of Nipple: Everted at rest and after stimulation  Comfort (Breast/Nipple): Soft / non-tender  Hold (Positioning): No assistance needed to correctly position infant at breast.  LATCH Score: 8  Interventions    Lactation Tools Discussed/Used     Consult Status Consult Status: Follow-up Date: 09/15/17 Follow-up type: In-patient    Pamelia HoitWeeks, Shenoa Hattabaugh D 09/14/2017, 9:26 AM

## 2017-09-15 MED ORDER — AMLODIPINE BESYLATE 10 MG PO TABS
10.0000 mg | ORAL_TABLET | Freq: Every day | ORAL | Status: DC
  Administered 2017-09-15 – 2017-09-16 (×2): 10 mg via ORAL
  Filled 2017-09-15 (×2): qty 1

## 2017-09-15 NOTE — Progress Notes (Signed)
Post Partum Day 1/ s/p completion of mag sulfate x 24 hr Subjective: no complaints, up ad lib, voiding and tolerating PO  Objective: Blood pressure (!) 144/97, pulse 68, temperature 97.7 F (36.5 C), temperature source Oral, resp. rate 16, height 5\' 4"  (1.626 m), weight 67.9 kg, SpO2 98 %, unknown if currently breastfeeding.  Physical Exam:  General: alert, cooperative and no distress Lochia: appropriate Uterine Fundus: firm Incision:  DVT Evaluation: No evidence of DVT seen on physical exam.  Recent Labs    09/13/17 1903 09/14/17 0302  HGB 11.8* 10.9*  HCT 36.0 33.2*    Assessment/Plan: Plan for discharge tomorrow Breast feeding BC=?? Add Norvasc 10 q d   LOS: 2 days   Tilda Burrow 09/15/2017, 9:51 AM

## 2017-09-15 NOTE — Lactation Note (Signed)
This note was copied from a baby's chart. Lactation Consultation Note; Mom reports baby has been latching well. Has had several bottles of formula through the night. Mom sleepy- offered assist but mom does not want to try right now. No questions at present. To call for assist prn  Patient Name: Ariel Hayes [id number] Date: 09/15/2017 Reason for consult: Follow-up assessment   Maternal Data Formula Feeding for Exclusion: Yes Reason for exclusion: Mother's choice to formula and breast feed on admission Has patient been taught Hand Expression?: Yes Does the patient have breastfeeding experience prior to this delivery?: Yes  Feeding    LATCH Score                   Interventions    Lactation Tools Discussed/Used     Consult Status Consult Status: Follow-up Date: 09/16/17 Follow-up type: In-patient    Ariel Hayes 09/15/2017, 8:57 AM

## 2017-09-15 NOTE — Clinical Social Work Maternal (Signed)
CLINICAL SOCIAL WORK MATERNAL/CHILD NOTE  Patient Details  Name: Tawonda Legaspi MRN: [id number] Date of Birth: 1985/12/29  Date:  09/15/2017  Clinical Social Worker Initiating Note:  Madilyn Fireman, MSW, LCSW-A Date/Time: Initiated:  09/15/17/1104     Child's Name:  Erroll Luna   Biological Parents:  Mother, Father   Need for Interpreter:  None   Reason for Referral:  Late or No Prenatal Care    Address:  [address]. [place] [address]    Phone number:  [phone] (home)     Additional phone number:   Household Members/Support Persons (HM/SP):   Household Member/Support Person 1   HM/SP Name Relationship DOB or Age  HM/SP -1 Daysi Boggan Husband, FOB    HM/SP -2        HM/SP -3        HM/SP -4        HM/SP -5        HM/SP -6        HM/SP -7        HM/SP -8          Natural Supports (not living in the home):  Extended Family, Friends, Immediate Family   Professional Supports: None   Employment: Unemployed   Type of Work:     Education:  Programmer, systems   Homebound arranged:    Museum/gallery curator Resources:      Other Resources:      Cultural/Religious Considerations Which May Impact Care:  None  Strengths:  Ability to meet basic needs , Pediatrician chosen, Home prepared for child    Psychotropic Medications:         Pediatrician:    Careers adviser area  Pediatrician List:   Corporate treasurer Pediatrics)  [place]      Pediatrician Fax Number:    Risk Factors/Current Problems:      Cognitive State:  Alert , Able to Concentrate , Goal Oriented    Mood/Affect:  Bright , Calm , Comfortable , Happy , Interested , Relaxed    CSW Assessment: CSW received consult for MOB due to lack of prenatal care. CSW met with MOB and baby Hailea at bedside to complete assessment. MOB reports she did not receive prenatal care due to lack  of insurance. MOB reports she lost her job about the same time she found out she was pregnant with her third child. MOB reports still being unemployed, but her husband Corene Cornea works full time. CSW informed MOB of hospital drug screening policies, MOB did not have questions or concerns. Initial UDS for newborn was negative, CSW will follow cord and make report if warranted. MOB denies any substance use while pregnant. MOB has a new car seat for safe transportation. MOB has a pack and play and crib at home for safe sleeping, SIDS precautions reviewed. MOB reports Hailea will receive pediatric care at [facility]. MOB reports not receiving WIC during pregnancy due to the family income being over the limit. CSW encouraged MOB to reapply since the baby was born and her family is now one person larger. MOB reports a great family support system. MOB's older children are 33 and 7. MOB has all items at home needed to care for baby. CSW encouraged MOB to reach out if needs were to arise, MOB expressed gratitude for CSW interaction.  CSW will continue  to monitor chart for cord results and will make report if warranted.  CSW Plan/Description:  No Further Intervention Required/No Barriers to Discharge, CSW Will Continue to Monitor Umbilical Cord Tissue Drug Screen Results and Make Report if Earlie Counts, [place] 09/15/2017, 11:06 AM

## 2017-09-16 MED ORDER — IBUPROFEN 600 MG PO TABS: 600.0000 mg | ORAL_TABLET | Freq: Four times a day (QID) | ORAL | 0 refills | Status: DC

## 2017-09-16 MED ORDER — HYDROCHLOROTHIAZIDE 25 MG PO TABS
25.0000 mg | ORAL_TABLET | Freq: Every day | ORAL | Status: DC
  Administered 2017-09-16: 25 mg via ORAL
  Filled 2017-09-16: qty 1

## 2017-09-16 MED ORDER — HYDROCHLOROTHIAZIDE 25 MG PO TABS: 25.0000 mg | ORAL_TABLET | Freq: Every day | ORAL | 0 refills | Status: DC

## 2017-09-16 MED ORDER — AMLODIPINE BESYLATE 10 MG PO TABS: 10.0000 mg | ORAL_TABLET | Freq: Every day | ORAL | 1 refills | Status: DC

## 2017-09-16 NOTE — Progress Notes (Signed)
Pt left Rx. Patient called and states she will return in the morning to pick them up.  RN advised patient to pick up Rx ASAP as they are BP medications.  Patient states she will be here early in the am.

## 2017-09-16 NOTE — Lactation Note (Addendum)
This note was copied from a baby's chart. Lactation Consultation Note  Patient Name: Girl Chalita Boughman [id number] Date: 09/16/2017 Reason for consult: Follow-up assessment;Infant weight loss;Other (Comment)(3rd baby ) Pecola Leisure is 74 hours old  Per mom recently fed the baby 45 ml  And changed a yellow diaper.  Per mom I plan to breast and formula due to working from home and  My nipples are alittle tender. LC instructed mom on the use comfort gels , breast shells,  And hand pump. Sore nipple and engorgement prevention and tx reviewed.  Mother informed of post-discharge support and given phone number to the lactation department, including services for phone call assistance; out-patient appointments; and breastfeeding support group. List of other breastfeeding resources in the community given in the handout. Encouraged mother to call for problems or concerns related to breastfeeding.  Maternal Data    Feeding  LATCH Score                   Interventions Interventions: Breast feeding basics reviewed  Lactation Tools Discussed/Used Tools: Shells;Pump;Comfort gels Shell Type: Inverted Breast pump type: Manual WIC Program: No Pump Review: Setup, frequency, and cleaning Initiated by:: MAI  Date initiated:: 09/16/17   Consult Status Consult Status: Complete Date: 09/16/17    Kathrin Greathouse 09/16/2017, 8:59 AM

## 2017-09-16 NOTE — Discharge Summary (Signed)
OB Discharge Summary     Patient Name: Ariel Hayes DOB: 1985/05/02 MRN: [id number]  Date of admission: 09/13/2017 Delivering MD: Marthenia Rolling   Date of discharge: 09/16/2017  Admitting diagnosis: 39.4WKS NO PNC, HBP Intrauterine pregnancy: [redacted]w[redacted]d     Secondary diagnosis:  Active Problems:   Preeclampsia, severe, third trimester   Admitted to labor and delivery  Additional problems: none     Discharge diagnosis: Term Pregnancy Delivered and Preeclampsia (severe)                                                                                                Post partum procedures:none  Augmentation: Foley Balloon  Complications: None  Hospital course:  Induction of Labor With Vaginal Delivery   32 y.o. yo G3P1003 at [redacted]w[redacted]d was admitted to the hospital 09/13/2017 for induction of labor.  Indication for induction: Preeclampsia and no prenatal care.  Patient had an uncomplicated labor course as follows: Membrane Rupture Time/Date: 4:18 AM ,09/14/2017   Intrapartum Procedures: Episiotomy: None [1]                                         Lacerations:  None [1]  Patient had delivery of a Viable infant.  Information for the patient's newborn:  Donise, Woodle [[id number]]  Delivery Method: Vaginal, Spontaneous(Filed from Delivery Summary)   09/14/2017  Details of delivery can be found in separate delivery note.  Patient had a routine postpartum course. Patient is discharged home 09/16/17.  Physical exam  Vitals:   09/15/17 1545 09/15/17 1927 09/15/17 2336 09/16/17 0307  BP: 137/87 (!) 129/94 130/84 (!) 150/96  Pulse: 76 73 71 69  Resp: 16 18 18 17   Temp: 98.6 F (37 C) 98.8 F (37.1 C) 98.4 F (36.9 C) 98.4 F (36.9 C)  TempSrc: Oral Oral Oral Oral  SpO2: 97% 100% 98% 97%  Weight:      Height:       General: alert, cooperative and no distress Lochia: appropriate Uterine Fundus: firm Incision: N/A DVT Evaluation: No evidence of DVT seen on physical exam. Labs: Lab  Results  Component Value Date   WBC 11.9 (H) 09/14/2017   HGB 10.9 (L) 09/14/2017   HCT 33.2 (L) 09/14/2017   MCV 87.6 09/14/2017   PLT 317 09/14/2017   CMP Latest Ref Rng & Units 09/13/2017  Glucose 70 - 99 mg/dL 71  BUN 6 - 20 mg/dL 10  Creatinine 7.82 - 9.56 mg/dL 2.13  Sodium 086 - 578 mmol/L 134(L)  Potassium 3.5 - 5.1 mmol/L 3.8  Chloride 98 - 111 mmol/L 105  CO2 22 - 32 mmol/L 17(L)  Calcium 8.9 - 10.3 mg/dL 4.6(N)  Total Protein 6.5 - 8.1 g/dL 6.5  Total Bilirubin 0.3 - 1.2 mg/dL 0.9  Alkaline Phos 38 - 126 U/L 175(H)  AST 15 - 41 U/L 25  ALT 0 - 44 U/L 16    Discharge instruction: per After Visit Summary and "Baby and Me Booklet".  After visit meds:  Allergies as of 09/16/2017      Reactions   Macrobid [nitrofurantoin] Nausea And Vomiting   Augmentin [amoxicillin-pot Clavulanate] Rash   Ceclor [cefaclor] Rash   Penicillins Rash      Medication List    STOP taking these medications   acetaminophen 325 MG tablet Commonly known as:  TYLENOL     TAKE these medications   amLODipine 10 MG tablet Commonly known as:  NORVASC Take 1 tablet (10 mg total) by mouth daily.   hydrochlorothiazide 25 MG tablet Commonly known as:  HYDRODIURIL Take 1 tablet (25 mg total) by mouth daily.   ibuprofen 600 MG tablet Commonly known as:  ADVIL,MOTRIN Take 1 tablet (600 mg total) by mouth every 6 (six) hours.   prenatal multivitamin Tabs tablet Take 1 tablet by mouth daily at 12 noon.       Diet: routine diet  Activity: Advance as tolerated. Pelvic rest for 6 weeks.   Outpatient follow up:BP check 1 week Follow up Appt:No future appointments. Follow up Visit:No follow-ups on file.  Postpartum contraception: Not Discussed  Newborn Data: Live born female  Birth Weight: 6 lb 2.4 oz (2790 g) APGAR: 9, 9  Newborn Delivery   Birth date/time:  09/14/2017 04:37:00 Delivery type:  Vaginal, Spontaneous     Baby Feeding: Breast Disposition:home with  mother   09/16/2017 Scheryl Darter, MD

## 2017-09-17 ENCOUNTER — Other Ambulatory Visit: Payer: Self-pay | Admitting: Obstetrics and Gynecology

## 2017-09-17 MED ORDER — AMLODIPINE BESYLATE 10 MG PO TABS: 10.0000 mg | ORAL_TABLET | Freq: Every day | ORAL | 2 refills | Status: AC

## 2017-09-17 MED ORDER — HYDROCHLOROTHIAZIDE 25 MG PO TABS: 25.0000 mg | ORAL_TABLET | Freq: Every day | ORAL | 2 refills | Status: AC

## 2017-09-17 MED ORDER — IBUPROFEN 600 MG PO TABS: 600.0000 mg | ORAL_TABLET | Freq: Four times a day (QID) | ORAL | 2 refills | Status: AC

## 2020-01-13 IMAGING — US US MFM OB COMP +14 WKS
1 series · 14 of 28 positions shown · non-contrast
Comparison: none

[Series 1: us mfm ob comp +14 wks · 56 acquisitions, 14 frames shown]
[im 3/56]
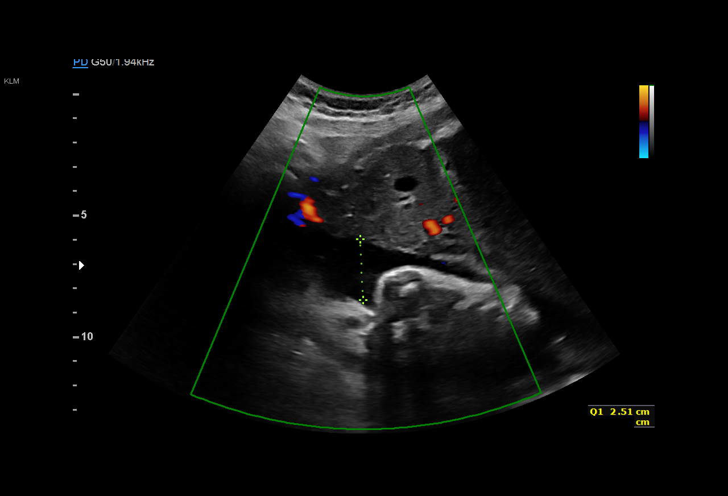
[im 7/56]
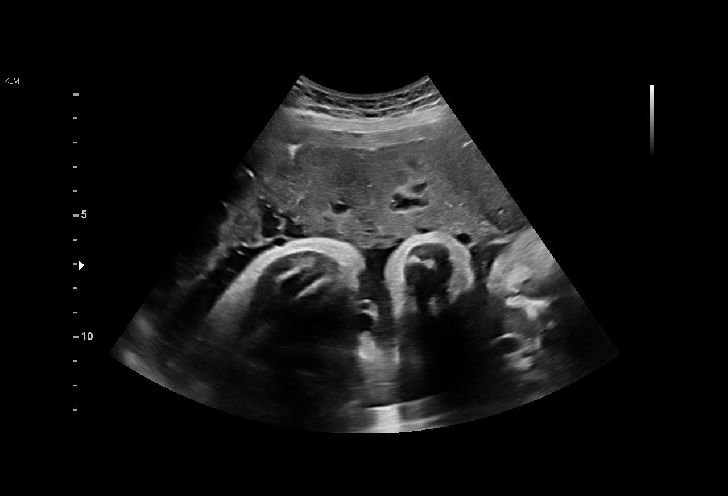
[im 11/56]
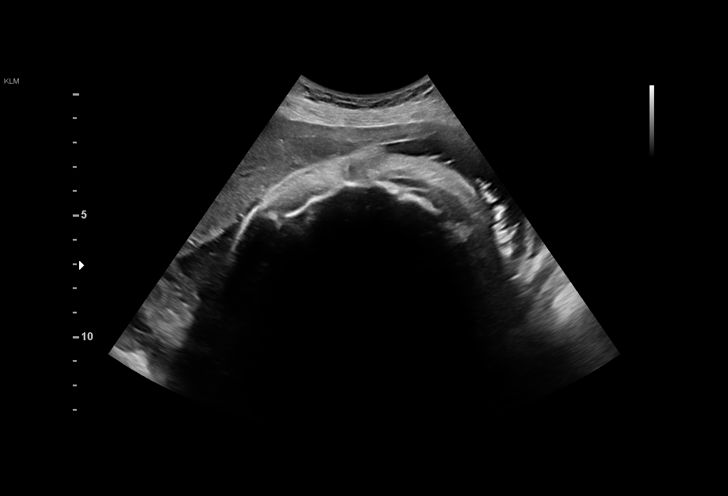
[im 15/56]
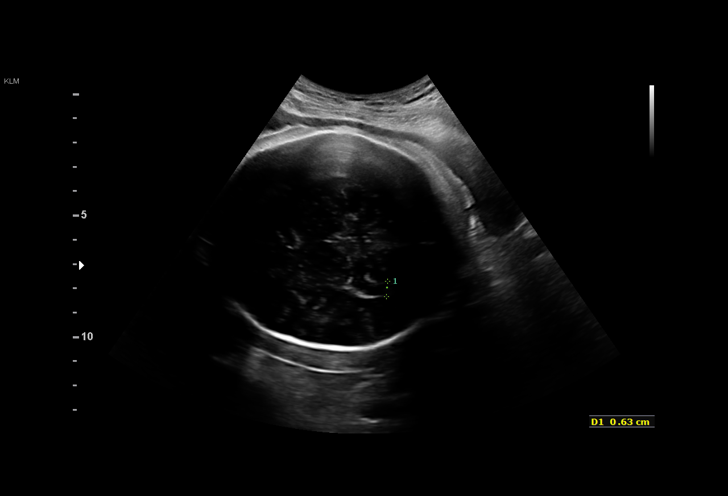
[im 19/56]
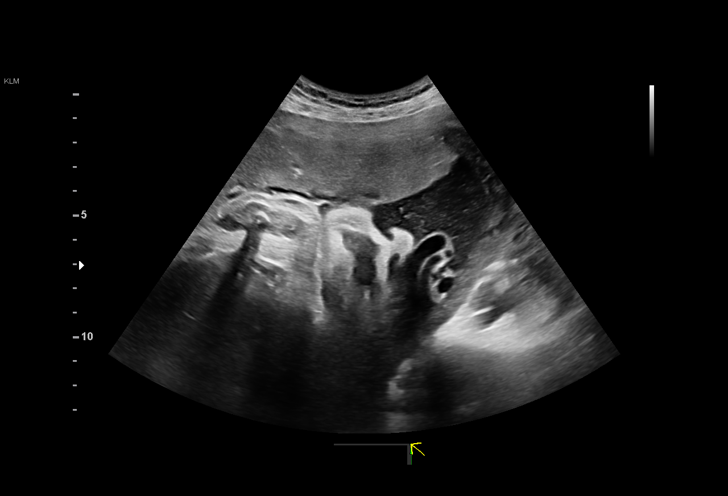
[im 23/56]
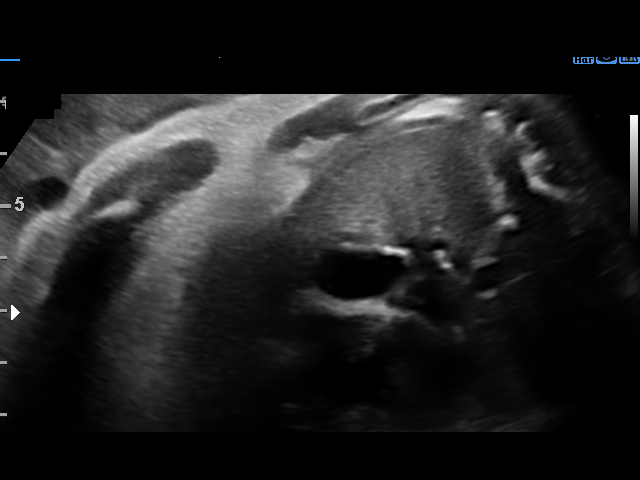
[im 27/56]
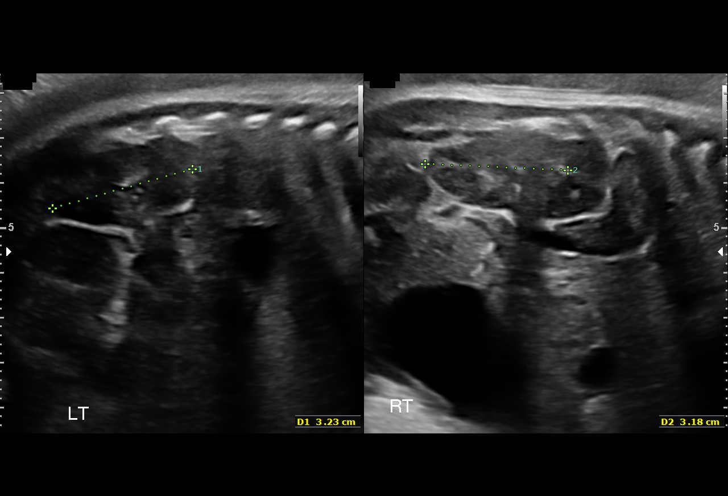
[im 31/56]
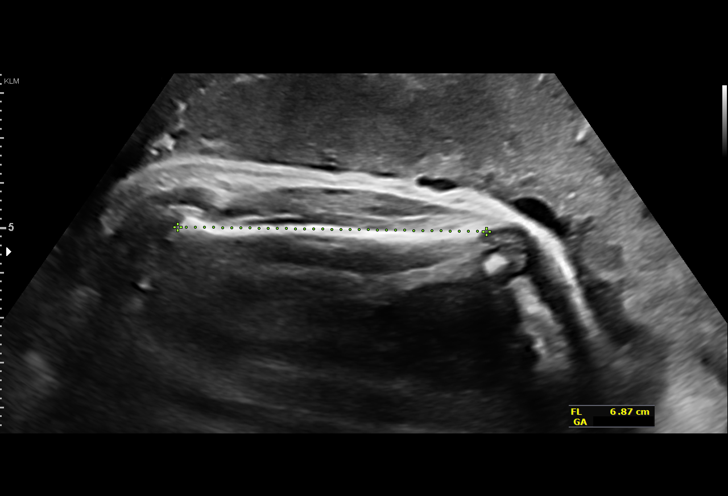
[im 35/56]
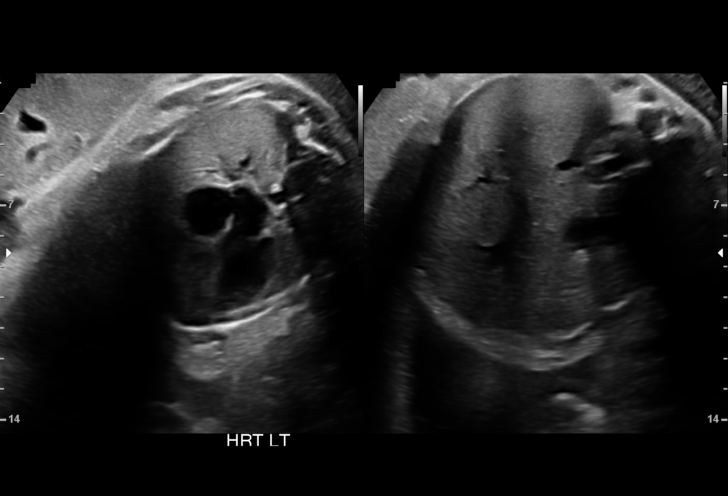
[im 39/56]
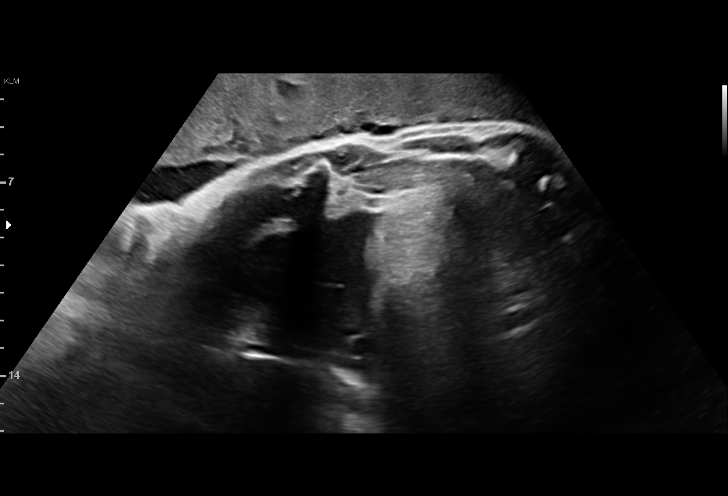
[im 43/56]
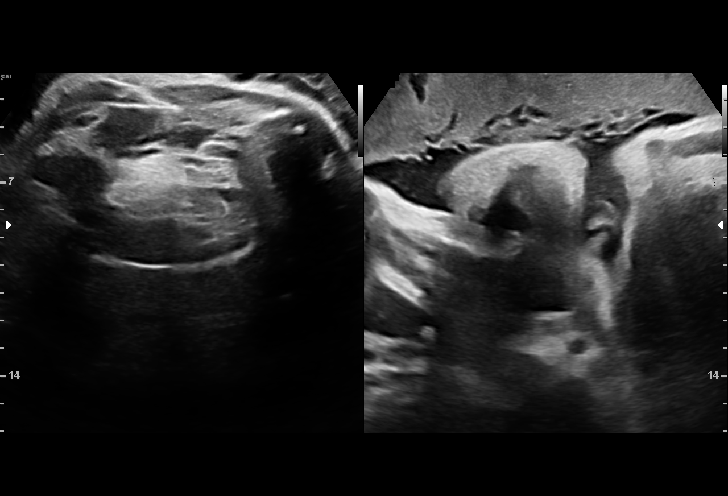
[im 47/56]
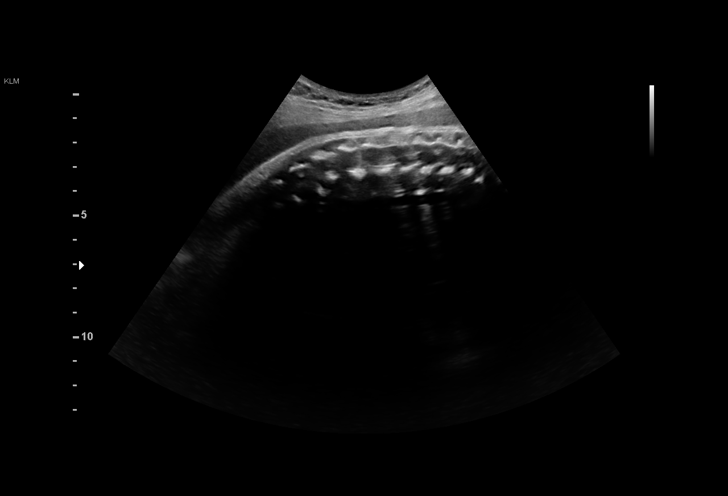
[im 51/56]
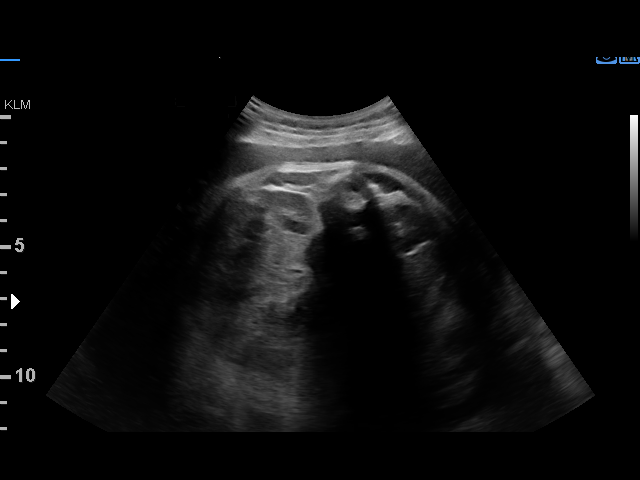
[im 56/56]
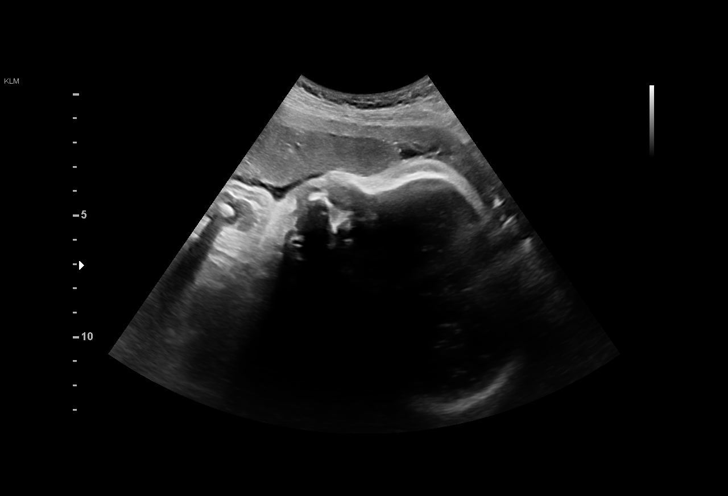

[14 of 28 positions shown; findings below may reference images not displayed]

Attending:        Sandah Derus        Secondary Phy.:    PEGGY G Nursing-
MAU/Triage

1  US MFM OB COMP + 14 WK               76805.01     MOFIDUL TIGER

Indications

Late to prenatal care, third trimester
36 weeks gestation of pregnancy
Poor obstetric history: Previous
preeclampsia / eclampsia/gestational HTN
Pre-eclampsia
Fetal Evaluation

Num Of Fetuses:          1
Fetal Heart Rate(bpm):   141
Cardiac Activity:        Observed
Presentation:            Cephalic
Placenta:                Anterior
P. Cord Insertion:       Visualized

Amniotic Fluid
AFI FV:      Within normal limits

AFI Sum(cm)     %Tile       Largest Pocket(cm)
12.74           43

RUQ(cm)       RLQ(cm)       LUQ(cm)        LLQ(cm)
2.51
Biometry

BPD:      89.2  mm     G. Age:  36w 1d         52  %    CI:        75.76   %    70 - 86
FL/HC:       21.3  %    20.1 -
HC:      324.9  mm     G. Age:  36w 6d         30  %    HC/AC:       0.99       0.93 -
AC:      329.7  mm     G. Age:  36w 6d         74  %    FL/BPD:      77.6  %    71 - 87
FL:       69.2  mm     G. Age:  35w 4d         24  %    FL/AC:       21.0  %    20 - 24
HUM:      60.3  mm     G. Age:  35w 0d         39  %

Est. FW:    3343   gm     6 lb 8 oz     66  %
OB History

Gravidity:    3         Term:   2        Prem:   0        SAB:   0
TOP:          0       Ectopic:  0        Living: 2
Gestational Age

U/S Today:     36w 3d                                        EDD:   10/08/17
Best:          36w 3d     Det. By:  U/S (09/13/17)           EDD:   10/08/17
Anatomy

Cranium:               Appears normal         Aortic Arch:            Not well visualized
Cavum:                 Not well visualized    Ductal Arch:            Not well visualized
Ventricles:            Appears normal         Diaphragm:              Not well visualized
Choroid Plexus:        Appears normal         Stomach:                Appears normal, left
sided
Cerebellum:            Appears normal         Abdomen:                Appears normal
Posterior Fossa:       Appears normal         Abdominal Wall:         Not well visualized
Nuchal Fold:           Not applicable (>20    Cord Vessels:           Appears normal (3
wks GA)                                        vessel cord)
Face:                  Not well visualized    Kidneys:                Appear normal
Lips:                  Appears normal         Bladder:                Appears normal
Thoracic:              Appears normal         Spine:                  Appears normal
Heart:                 Not well visualized    Upper Extremities:      Visualized
RVOT:                  Not well visualized    Lower Extremities:      Visualized
LVOT:                  Appears normal

Other:  Technically difficult due to advanced GA and fetal position.
Doppler - Fetal Vessels

Umbilical Artery
S/D     %tile
2.75       68

Cervix Uterus Adnexa

Cervix
Not visualized (advanced GA >53wks)
Impression

Insufficient prenatal care. Admitted with diagnosis of
preeclampsia with severe features.
Fetal biometry is consistent with 36w 3d. Assigned EDD:
10/08/17.
Amniotic fluid is normal and good fetal activity is seen.
Cephalic presentation. Umbilical artery Doppler showed
normal forward diastolic flow. Fetal anatomy appears normal,
but limited by advanced gestational age.
